# Patient Record
Sex: Male | Born: 1995 | Race: Black or African American | Hispanic: No | Marital: Single | Smoking: Never smoker
Health system: Southern US, Community
[De-identification: ages and names within clinical notes are randomized; demographics above are authoritative.]

## PROBLEM LIST (undated history)

## (undated) HISTORY — PX: ANKLE SURGERY: SHX546

## (undated) HISTORY — PX: ELBOW SURGERY: SHX618

---

## 2015-12-20 ENCOUNTER — Emergency Department (HOSPITAL_COMMUNITY): Payer: Self-pay

## 2015-12-20 ENCOUNTER — Encounter (HOSPITAL_COMMUNITY): Payer: Self-pay | Admitting: Emergency Medicine

## 2015-12-20 DIAGNOSIS — Y929 Unspecified place or not applicable: Secondary | ICD-10-CM | POA: Insufficient documentation

## 2015-12-20 DIAGNOSIS — Y999 Unspecified external cause status: Secondary | ICD-10-CM | POA: Insufficient documentation

## 2015-12-20 DIAGNOSIS — W230XXA Caught, crushed, jammed, or pinched between moving objects, initial encounter: Secondary | ICD-10-CM | POA: Insufficient documentation

## 2015-12-20 DIAGNOSIS — L03011 Cellulitis of right finger: Secondary | ICD-10-CM | POA: Insufficient documentation

## 2015-12-20 DIAGNOSIS — S6991XA Unspecified injury of right wrist, hand and finger(s), initial encounter: Secondary | ICD-10-CM | POA: Insufficient documentation

## 2015-12-20 DIAGNOSIS — Y939 Activity, unspecified: Secondary | ICD-10-CM | POA: Insufficient documentation

## 2015-12-20 NOTE — ED Triage Notes (Signed)
Patient closed right middle finger in car door 1 week ago. States initially the finger hurt, but didn't appear abnormal. Presents tonight because finger tip is swollen and discolored. Entire right middle finger appears swollen. Denies pain beyond the tip of that finger.

## 2015-12-21 ENCOUNTER — Emergency Department (HOSPITAL_COMMUNITY)
Admission: EM | Admit: 2015-12-21 | Discharge: 2015-12-21 | Disposition: A | Discharge status: Home / Self Care | Payer: Self-pay | Attending: Emergency Medicine | Admitting: Emergency Medicine

## 2015-12-21 DIAGNOSIS — L03011 Cellulitis of right finger: Secondary | ICD-10-CM

## 2015-12-21 MED ORDER — SULFAMETHOXAZOLE-TRIMETHOPRIM 800-160 MG PO TABS: 1.0000 | ORAL_TABLET | Freq: Two times a day (BID) | ORAL | 0 refills | Status: AC

## 2015-12-21 MED ORDER — LIDOCAINE HCL (PF) 1 % IJ SOLN
20.0000 mL | Freq: Once | INTRAMUSCULAR | Status: AC
  Administered 2015-12-21: 20 mL
  Filled 2015-12-21: qty 20

## 2015-12-21 MED ORDER — TRAMADOL HCL 50 MG PO TABS: 50.0000 mg | ORAL_TABLET | Freq: Four times a day (QID) | ORAL | 0 refills | Status: AC | PRN

## 2015-12-21 MED ORDER — TRAMADOL HCL 50 MG PO TABS
50.0000 mg | ORAL_TABLET | Freq: Once | ORAL | Status: AC
  Administered 2015-12-21: 50 mg via ORAL
  Filled 2015-12-21: qty 1

## 2015-12-21 MED ORDER — SULFAMETHOXAZOLE-TRIMETHOPRIM 800-160 MG PO TABS
1.0000 | ORAL_TABLET | Freq: Once | ORAL | Status: AC
  Administered 2015-12-21: 1 via ORAL
  Filled 2015-12-21: qty 1

## 2015-12-21 NOTE — ED Notes (Signed)
Pt stable, ambulatory, states understanding of discharge instructions 

## 2015-12-21 NOTE — ED Provider Notes (Signed)
[facility]-EMERGENCY DEPT Provider Note   CSN: [id number] Arrival date & time: 12/20/15  2158  History   Chief Complaint Chief Complaint  Patient presents with  . Finger Injury    HPI Cody Torres is a 20 y.o. male.  HPI   Patient with PMH of ankle and elbow surgery comes to the ER with complaints of right middle finger pain. He closed his finger in the door 1 week ago. It hurt a little bit at that time but over the past few days the pain has increased and his finger has begun to swell and now throbs. He has not had fever or difficulty moving his finger. No N/V/D. He denies CP, weakness, confusion, hx of skin infections.    History reviewed. No pertinent past medical history.  There are no active problems to display for this patient.   Past Surgical History:  Procedure Laterality Date  . ANKLE SURGERY Right   . ELBOW SURGERY Right     Home Medications    Prior to Admission medications   Medication Sig Start Date End Date Taking? Authorizing Provider  sulfamethoxazole-trimethoprim (BACTRIM DS,SEPTRA DS) 800-160 MG tablet Take 1 tablet by mouth 2 (two) times daily. 12/21/15 12/28/15  Marlon Peliffany Ipek Westra, PA-C  traMADol (ULTRAM) 50 MG tablet Take 1 tablet (50 mg total) by mouth every 6 (six) hours as needed. 12/21/15   Marlon Peliffany Aqil Goetting, PA-C    Family History History reviewed. No pertinent family history.  Social History Social History  Substance Use Topics  . Smoking status: Never Smoker  . Smokeless tobacco: Never Used  . Alcohol use No     Allergies   Review of patient's allergies indicates no known allergies.   Review of Systems Review of Systems  Review of Systems All other systems negative except as documented in the HPI. All pertinent positives and negatives as reviewed in the HPI.  Physical Exam Updated Vital Signs BP 154/92   Pulse (!) 49   Temp 98.4 F (36.9 C) (Oral)   Resp 12   Ht 5\' 10"  (1.778 m)   Wt 105.7 kg   SpO2 97%   BMI 33.43 kg/m   Physical  Exam  Constitutional: He appears well-developed and well-nourished. No distress.  HENT:  Head: Normocephalic and atraumatic.  Eyes: Pupils are equal, round, and reactive to light.  Neck: Normal range of motion. Neck supple.  Cardiovascular: Normal rate and regular rhythm.   Pulmonary/Chest: Effort normal.  Abdominal: Soft.  Musculoskeletal:       Right hand: He exhibits tenderness and swelling. He exhibits normal range of motion, no bony tenderness, normal two-point discrimination, normal capillary refill, no deformity and no laceration. Normal sensation noted. Normal strength noted.       Hands: FROM, intact sensation  Neurological: He is alert.  Skin: Skin is warm and dry.  Nursing note and vitals reviewed.    ED Treatments / Results  Labs (all labs ordered are listed, but only abnormal results are displayed) Labs Reviewed - No data to display  EKG  EKG Interpretation None       Radiology Dg Finger Middle Right  Result Date: 12/20/2015 CLINICAL DATA:  Middle finger swelling, post trauma 1 week ago. EXAM: RIGHT MIDDLE FINGER 2+V COMPARISON:  None. FINDINGS: There is no evidence of fracture or dislocation. There is no evidence of arthropathy or other focal bone abnormality. Soft tissues demonstrate diffuse swelling of the right middle finger  . IMPRESSION: No acute fracture or dislocation identified about the right  middle finger. Diffuse soft tissue swelling. Electronically Signed   By: Ted Mcalpine M.D.   On: 12/20/2015 22:52   Patient to return to the ED on Monday for wound recheck, started on bactrim and ultram.  Procedures Procedures (including critical care time)  Medications Ordered in ED Medications  sulfamethoxazole-trimethoprim (BACTRIM DS,SEPTRA DS) 800-160 MG per tablet 1 tablet (not administered)  traMADol (ULTRAM) tablet 50 mg (not administered)  lidocaine (PF) (XYLOCAINE) 1 % injection 20 mL (20 mLs Other Given 12/21/15 0138)     Initial  Impression / Assessment and Plan / ED Course  I have reviewed the triage vital signs and the nursing notes.  Pertinent labs & imaging results that were available during my care of the patient were reviewed by me and considered in my medical decision making (see chart for details).  Clinical Course    INCISION AND DRAINAGE Performed by: Dorthula Matas Consent: Verbal consent obtained. Risks and benefits: risks, benefits and alternatives were discussed Type: abscess  Body area: middle right finger  Anesthesia: digital block  Incision was made with a scalpel.  Local anesthetic: lidocaine 2% wo epinephrine  Anesthetic total: 3 ml  Complexity: complex Blunt dissection to break up loculations  Drainage: purulent  Drainage amount: moderate  Packing material: none Patient tolerance: Patient tolerated the procedure well with no immediate complications.    Final Clinical Impressions(s) / ED Diagnoses   Final diagnoses:  Paronychia of right middle finger    New Prescriptions New Prescriptions   SULFAMETHOXAZOLE-TRIMETHOPRIM (BACTRIM DS,SEPTRA DS) 800-160 MG TABLET    Take 1 tablet by mouth 2 (two) times daily.   TRAMADOL (ULTRAM) 50 MG TABLET    Take 1 tablet (50 mg total) by mouth every 6 (six) hours as needed.     Marlon Pel, PA-C 12/21/15 1610    Lorre Nick, MD 12/22/15 [phone]

## 2016-10-21 IMAGING — CR DG FINGER MIDDLE 2+V*R*
3 series · 3 of 3 positions shown · non-contrast
Comparison: None.

CLINICAL DATA: Middle finger swelling, post trauma 1 week ago.

EXAM:
RIGHT MIDDLE FINGER 2+V

[finger ap]
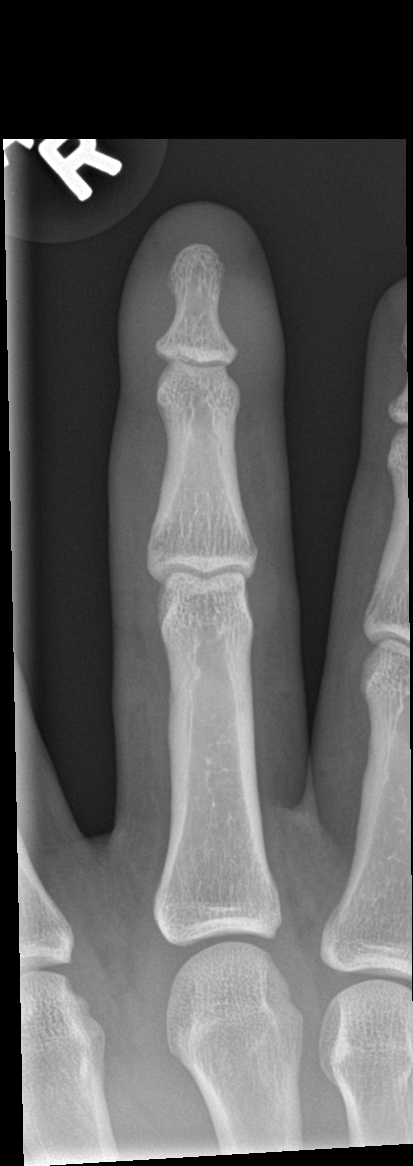

[finger obl]
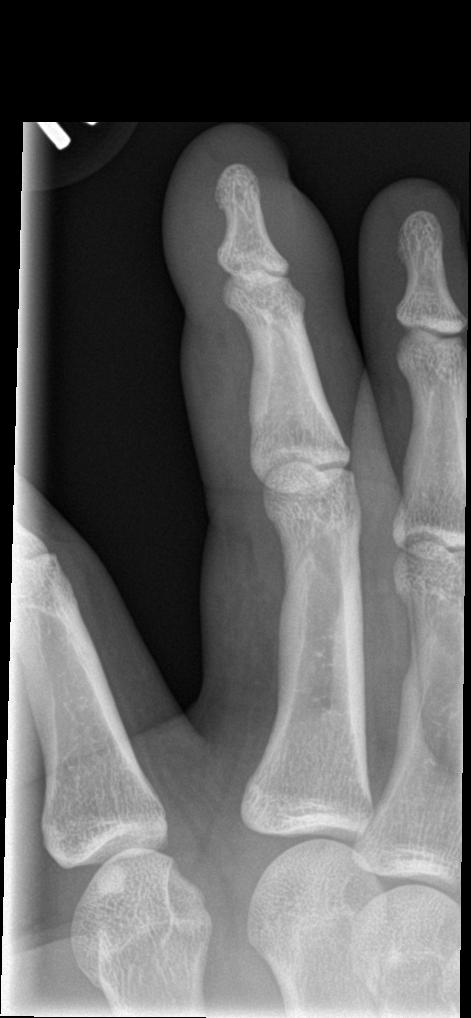

[finger lat]
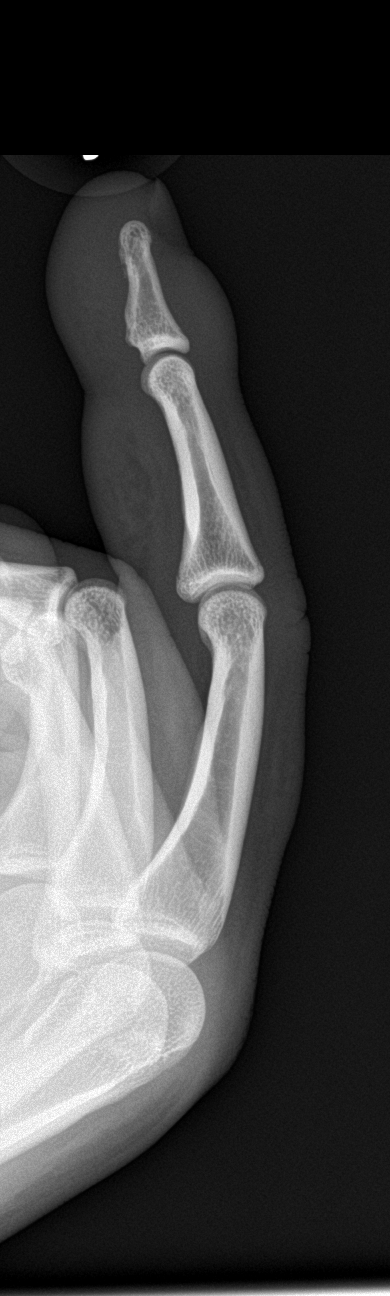

[3 of 3 positions shown; findings below may reference images not displayed]

FINDINGS: There is no evidence of fracture or dislocation. There is no
evidence of arthropathy or other focal bone abnormality. Soft
tissues demonstrate diffuse swelling of the right middle finger.
IMPRESSION: No acute fracture or dislocation identified about the right middle
finger.

Diffuse soft tissue swelling.

## 2017-01-08 ENCOUNTER — Ambulatory Visit: Payer: Self-pay | Admitting: Family Medicine

## 2017-01-12 ENCOUNTER — Ambulatory Visit: Payer: Self-pay | Admitting: Family Medicine
